# Patient Record
Sex: Female | Born: 1985 | Race: Black or African American | Hispanic: No | Marital: Single | Smoking: Current some day smoker
Health system: Southern US, Community
[De-identification: ages and names within clinical notes are randomized; demographics above are authoritative.]

---

## 2020-12-14 ENCOUNTER — Emergency Department (HOSPITAL_COMMUNITY): Payer: Medicaid Other

## 2020-12-14 ENCOUNTER — Encounter (HOSPITAL_COMMUNITY): Payer: Self-pay | Admitting: Emergency Medicine

## 2020-12-14 ENCOUNTER — Encounter (HOSPITAL_BASED_OUTPATIENT_CLINIC_OR_DEPARTMENT_OTHER): Payer: Self-pay | Admitting: *Deleted

## 2020-12-14 ENCOUNTER — Emergency Department (HOSPITAL_COMMUNITY)
Admission: EM | Admit: 2020-12-14 | Discharge: 2020-12-14 | Discharge status: Against Medical Advice | Payer: Medicaid Other | Source: Home / Self Care | Attending: Emergency Medicine | Admitting: Emergency Medicine

## 2020-12-14 ENCOUNTER — Emergency Department (HOSPITAL_BASED_OUTPATIENT_CLINIC_OR_DEPARTMENT_OTHER)
Admission: EM | Admit: 2020-12-14 | Discharge: 2020-12-14 | Disposition: A | Discharge status: Home / Self Care | Payer: Medicaid Other | Attending: Emergency Medicine | Admitting: Emergency Medicine

## 2020-12-14 ENCOUNTER — Other Ambulatory Visit: Payer: Self-pay

## 2020-12-14 DIAGNOSIS — X58XXXA Exposure to other specified factors, initial encounter: Secondary | ICD-10-CM | POA: Diagnosis not present

## 2020-12-14 DIAGNOSIS — Z23 Encounter for immunization: Secondary | ICD-10-CM | POA: Insufficient documentation

## 2020-12-14 DIAGNOSIS — Z5321 Procedure and treatment not carried out due to patient leaving prior to being seen by health care provider: Secondary | ICD-10-CM | POA: Insufficient documentation

## 2020-12-14 DIAGNOSIS — S61211A Laceration without foreign body of left index finger without damage to nail, initial encounter: Secondary | ICD-10-CM | POA: Insufficient documentation

## 2020-12-14 DIAGNOSIS — R0789 Other chest pain: Secondary | ICD-10-CM | POA: Insufficient documentation

## 2020-12-14 LAB — CBC
HCT: 43 % (ref 36.0–46.0)
Hemoglobin: 14.3 g/dL (ref 12.0–15.0)
MCH: 31.6 pg (ref 26.0–34.0)
MCHC: 33.3 g/dL (ref 30.0–36.0)
MCV: 94.9 fL (ref 80.0–100.0)
Platelets: 417 10*3/uL — ABNORMAL HIGH (ref 150–400)
RBC: 4.53 MIL/uL (ref 3.87–5.11)
RDW: 13.5 % (ref 11.5–15.5)
WBC: 11.3 10*3/uL — ABNORMAL HIGH (ref 4.0–10.5)
nRBC: 0 % (ref 0.0–0.2)

## 2020-12-14 LAB — I-STAT BETA HCG BLOOD, ED (MC, WL, AP ONLY): I-stat hCG, quantitative: <5 m[IU]/mL (ref <5)

## 2020-12-14 LAB — TROPONIN I (HIGH SENSITIVITY)
Troponin I (High Sensitivity): 5 ng/L (ref <18)
Troponin I (High Sensitivity): 5 ng/L (ref <18)

## 2020-12-14 LAB — BASIC METABOLIC PANEL
Anion gap: 7 (ref 5–15)
BUN: 5 mg/dL — ABNORMAL LOW (ref 6–20)
CO2: 28 mmol/L (ref 22–32)
Calcium: 9.1 mg/dL (ref 8.9–10.3)
Chloride: 103 mmol/L (ref 98–111)
Creatinine, Ser: 0.79 mg/dL (ref 0.44–1.00)
GFR, Estimated: >60 mL/min (ref >60)
Glucose, Bld: 95 mg/dL (ref 70–99)
Potassium: 4 mmol/L (ref 3.5–5.1)
Sodium: 138 mmol/L (ref 135–145)

## 2020-12-14 MED ORDER — TETANUS-DIPHTH-ACELL PERTUSSIS 5-2.5-18.5 LF-MCG/0.5 IM SUSY
0.5000 mL | PREFILLED_SYRINGE | Freq: Once | INTRAMUSCULAR | Status: AC
  Administered 2020-12-14: 0.5 mL via INTRAMUSCULAR
  Filled 2020-12-14: qty 0.5
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
## 2021-03-28 ENCOUNTER — Encounter (HOSPITAL_BASED_OUTPATIENT_CLINIC_OR_DEPARTMENT_OTHER): Payer: Self-pay | Admitting: Obstetrics and Gynecology

## 2021-03-28 ENCOUNTER — Other Ambulatory Visit: Payer: Self-pay

## 2021-03-28 DIAGNOSIS — N3000 Acute cystitis without hematuria: Secondary | ICD-10-CM | POA: Diagnosis not present

## 2021-03-28 DIAGNOSIS — N390 Urinary tract infection, site not specified: Secondary | ICD-10-CM | POA: Insufficient documentation

## 2021-03-28 DIAGNOSIS — F17210 Nicotine dependence, cigarettes, uncomplicated: Secondary | ICD-10-CM | POA: Insufficient documentation

## 2021-03-28 DIAGNOSIS — R3915 Urgency of urination: Secondary | ICD-10-CM | POA: Diagnosis present

## 2021-03-28 LAB — URINALYSIS, ROUTINE W REFLEX MICROSCOPIC
Bilirubin Urine: NEGATIVE
Glucose, UA: NEGATIVE mg/dL
Ketones, ur: NEGATIVE mg/dL
Nitrite: POSITIVE — AB
Specific Gravity, Urine: 1.029 (ref 1.005–1.030)
Trans Epithel, UA: 2
WBC, UA: >50 WBC/hpf — ABNORMAL HIGH (ref 0–5)
pH: 6 (ref 5.0–8.0)

## 2021-03-28 LAB — PREGNANCY, URINE: Preg Test, Ur: NEGATIVE

## 2021-03-28 NOTE — ED Triage Notes (Signed)
Patient reports she has thinks she has a UTI. Patient states her urine is cloudy, she has abdominal/pelvic pressure and frequency.

## 2021-03-29 ENCOUNTER — Emergency Department (HOSPITAL_BASED_OUTPATIENT_CLINIC_OR_DEPARTMENT_OTHER)
Admission: EM | Admit: 2021-03-29 | Discharge: 2021-03-29 | Disposition: A | Discharge status: Home / Self Care | Payer: Medicaid Other | Attending: Emergency Medicine | Admitting: Emergency Medicine

## 2021-03-29 DIAGNOSIS — N3000 Acute cystitis without hematuria: Secondary | ICD-10-CM

## 2021-03-29 MED ORDER — CEPHALEXIN 500 MG PO CAPS: 500.0000 mg | ORAL_CAPSULE | Freq: Three times a day (TID) | ORAL | 0 refills | Status: AC

## 2021-03-29 MED ORDER — CEPHALEXIN 250 MG PO CAPS
500.0000 mg | ORAL_CAPSULE | Freq: Once | ORAL | Status: AC
  Administered 2021-03-29: 500 mg via ORAL
  Filled 2021-03-29: qty 2

## 2021-03-29 NOTE — ED Provider Notes (Signed)
[facility] EMERGENCY DEPT Provider Note   CSN: [id number] Arrival date & time: 03/28/21  2244     History Chief Complaint  Patient presents with   Urinary Tract Infection    Denise Long is a 35 y.o. female.  HPI     This is a 35 year old female who presents with urinary urgency.  Patient reports that she has had some pressure-like urgency over the last several days.  She states that she feels an urge to urinate frequently.  No specific dysuria.  She reports some pressure in the suprapubic region.  No fevers or back pain.  She has a history of UTIs while pregnant; however, she does not believe herself to be pregnant.  Onset of symptoms was yesterday.  He has noted a dark discoloration to her urine with a foul smell.  History reviewed. No pertinent past medical history.  There are no problems to display for this patient.   History reviewed. No pertinent surgical history.   OB History     Gravida      Para      Term      Preterm      AB      Living  3      SAB      IAB      Ectopic      Multiple      Live Births              No family history on file.  Social History   Tobacco Use   Smoking status: Every Day    Packs/day: 0.25    Years: 10.00    Pack years: 2.50    Types: Cigarettes    Passive exposure: Never   Smokeless tobacco: Never  Vaping Use   Vaping Use: Every day   Substances: Nicotine, Flavoring  Substance Use Topics   Alcohol use: Yes    Comment: Social   Drug use: Yes    Types: Cocaine, Marijuana    Home Medications Prior to Admission medications   Medication Sig Start Date End Date Taking? Authorizing Provider  cephALEXin (KEFLEX) 500 MG capsule Take 1 capsule (500 mg total) by mouth 3 (three) times daily. 03/29/21  Yes Kaevon Cotta, Mayer Masker, MD    Allergies    Sulfa antibiotics  Review of Systems   Review of Systems  Constitutional:  Negative for fever.  Gastrointestinal:  Negative for abdominal pain,  nausea and vomiting.  Genitourinary:  Positive for urgency. Negative for dysuria, frequency and vaginal discharge.  All other systems reviewed and are negative.  Physical Exam Updated Vital Signs BP (!) 177/124   Pulse 70   Temp 98.1 F (36.7 C)   Resp 16   SpO2 100%   Physical Exam Vitals and nursing note reviewed.  Constitutional:      Appearance: She is well-developed. She is not ill-appearing.  HENT:     Head: Normocephalic and atraumatic.  Eyes:     Pupils: Pupils are equal, round, and reactive to light.  Cardiovascular:     Rate and Rhythm: Normal rate and regular rhythm.     Heart sounds: Normal heart sounds.  Pulmonary:     Effort: Pulmonary effort is normal. No respiratory distress.     Breath sounds: No wheezing.  Abdominal:     General: Bowel sounds are normal.     Palpations: Abdomen is soft.     Tenderness: There is no right CVA tenderness or left CVA tenderness.  Musculoskeletal:     Cervical back: Neck supple.     Right lower leg: No edema.     Left lower leg: No edema.  Skin:    General: Skin is warm and dry.  Neurological:     Mental Status: She is alert and oriented to person, place, and time.  Psychiatric:        Mood and Affect: Mood normal.    ED Results / Procedures / Treatments   Labs (all labs ordered are listed, but only abnormal results are displayed) Labs Reviewed  URINALYSIS, ROUTINE W REFLEX MICROSCOPIC - Abnormal; Notable for the following components:      Result Value   APPearance HAZY (*)    Hgb urine dipstick LARGE (*)    Protein, ur TRACE (*)    Nitrite POSITIVE (*)    Leukocytes,Ua LARGE (*)    WBC, UA >50 (*)    Bacteria, UA MANY (*)    All other components within normal limits  URINE CULTURE  PREGNANCY, URINE    EKG None  Radiology No results found.  Procedures Procedures   Medications Ordered in ED Medications  cephALEXin (KEFLEX) capsule 500 mg (has no administration in time range)    ED Course  I have  reviewed the triage vital signs and the nursing notes.  Pertinent labs & imaging results that were available during my care of the patient were reviewed by me and considered in my medical decision making (see chart for details).    MDM Rules/Calculators/A&P                           Patient presents with urinary symptoms.  She is overall nontoxic and vital signs are reassuring.  She has no signs or symptoms of pyelonephritis.  Symptoms consistent with UTI.  Urinalysis reviewed showing greater than 50 white cells and many bacteria with white blood cell clumps.  It is also nitrite positive.  This is very consistent with urinary tract infection.  Suspect cystitis.  Urine culture was sent.  Will start on Keflex.  After history, exam, and medical workup I feel the patient has been appropriately medically screened and is safe for discharge home. Pertinent diagnoses were discussed with the patient. Patient was given return precautions.  Final Clinical Impression(s) / ED Diagnoses Final diagnoses:  Acute cystitis without hematuria    Rx / DC Orders ED Discharge Orders          Ordered    cephALEXin (KEFLEX) 500 MG capsule  3 times daily        03/29/21 0325             Jeshurun [place], Mayer Masker, MD 03/29/21 [phone]

## 2021-03-31 LAB — URINE CULTURE: Culture: >=100000 — AB

## 2021-04-01 ENCOUNTER — Telehealth: Payer: Self-pay | Admitting: Emergency Medicine

## 2021-04-01 NOTE — Telephone Encounter (Signed)
Post ED Visit - Positive Culture Follow-up  Culture report reviewed by antimicrobial stewardship pharmacist: [facility] []  , Pharm.D. []  Enzo Bi, Pharm.D., BCPS AQ-ID []  , Pharm.D., BCPS []  Celedonio Miyamoto, Pharm.D., BCPS []  [place], Garvin Fila.D., BCPS, AAHIVP []  , Pharm.D., BCPS, AAHIVP []  Georgina Pillion, PharmD, BCPS []  , PharmD, BCPS []  [place], PharmD, BCPS [x]  [address], PharmD []  , PharmD, BCPS []  Estella Husk, PharmD  Pharmacy Team []  Lysle Pearl, PharmD []  , PharmD []  Phillips Climes, PharmD []  , Rph []  Agapito Games) , PharmD []  Loleta Dicker, PharmD []  , PharmD []  Mervyn Gay, PharmD []  , PharmD []  Vinnie Level, PharmD []  [facility], PharmD []  , PharmD []  Len Childs, PharmD   Positive urine culture Treated with Cephalexin, organism sensitive to the same and no further patient follow-up is required at this time.  Evadene Wardrip 04/01/2021, 11:04 AM

## 2021-10-16 IMAGING — DX DG CHEST 2V
2 series · 2 of 2 positions shown · non-contrast
Comparison: 03/04/2018

CLINICAL DATA: Chest pain

EXAM:
CHEST - 2 VIEW

[w chest pa]
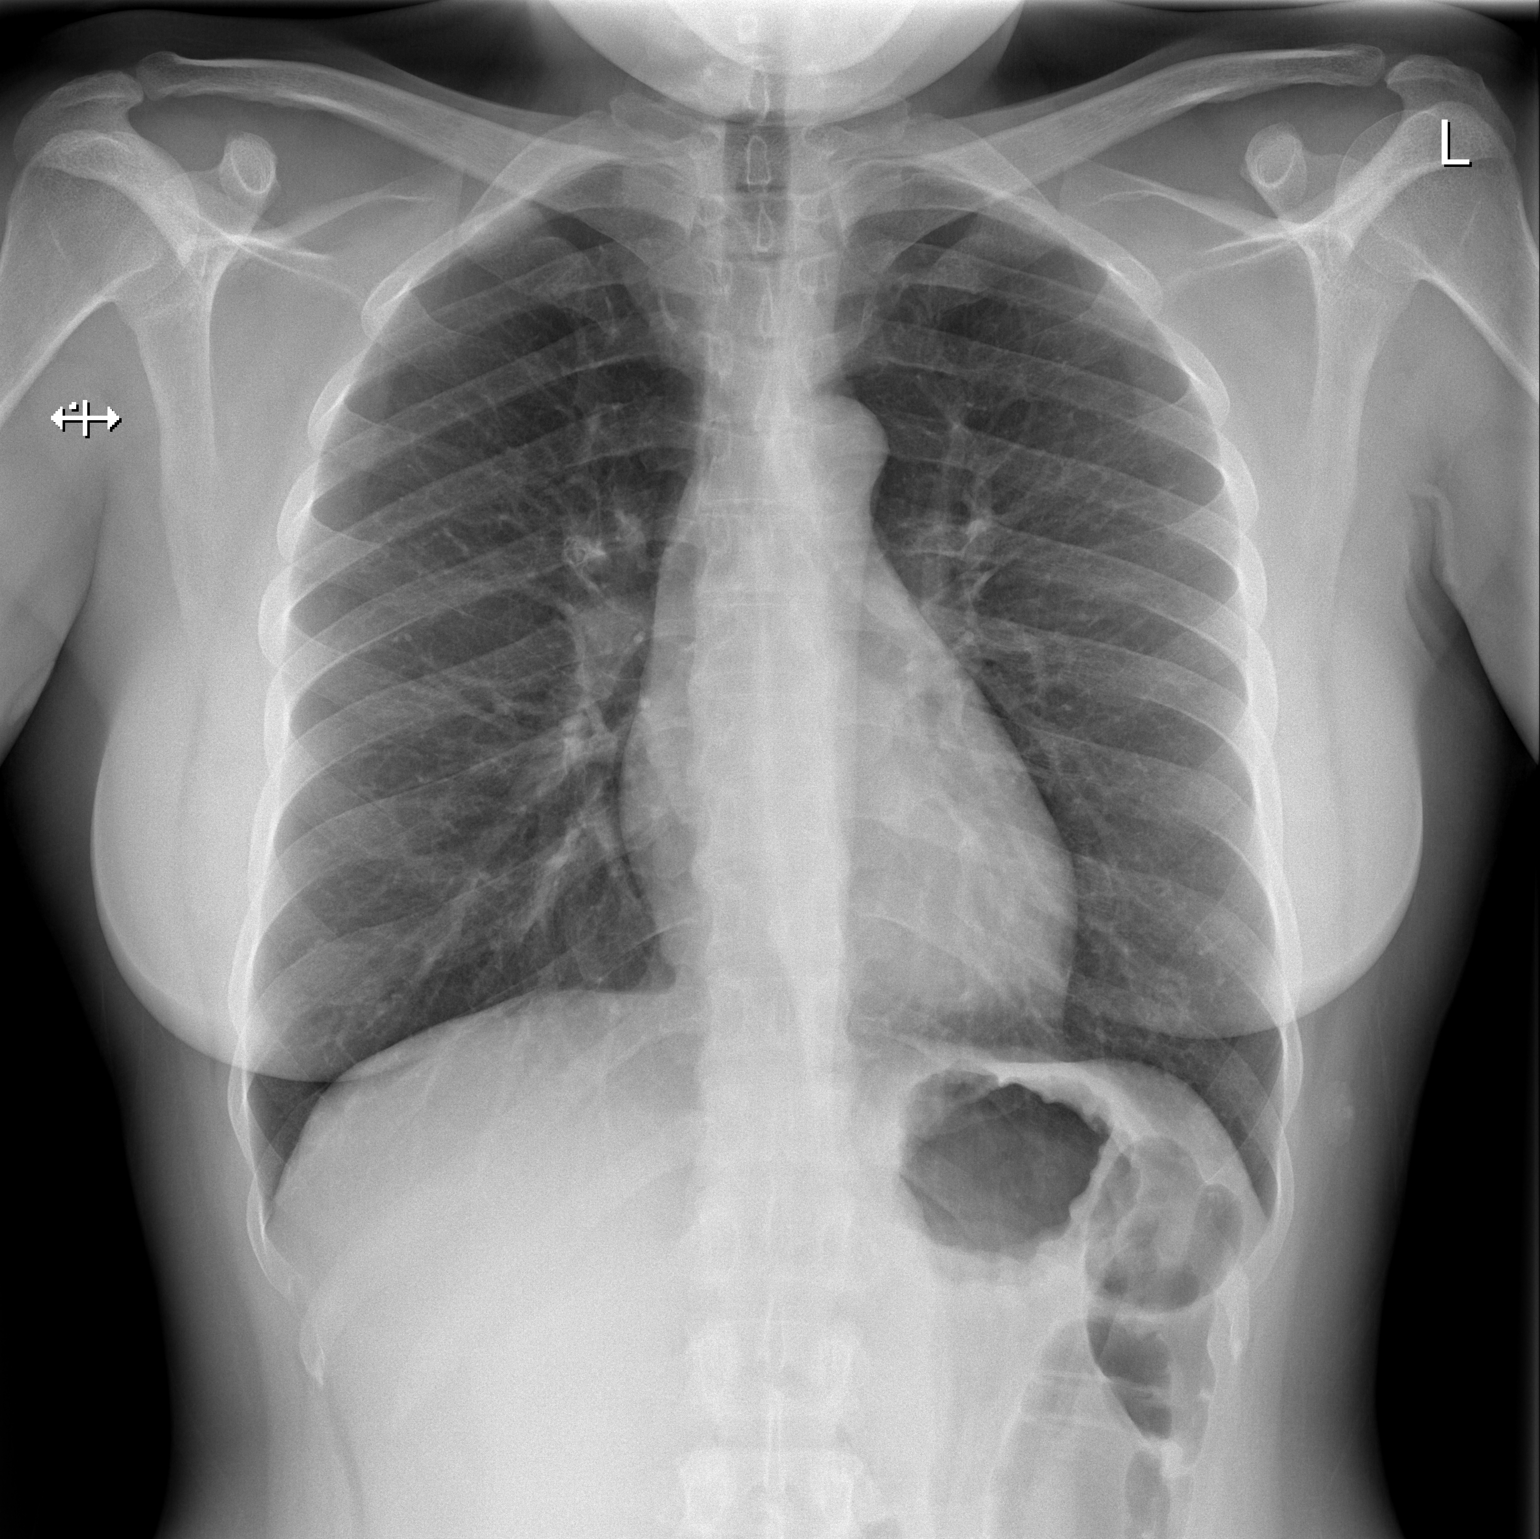

[w chest lat]
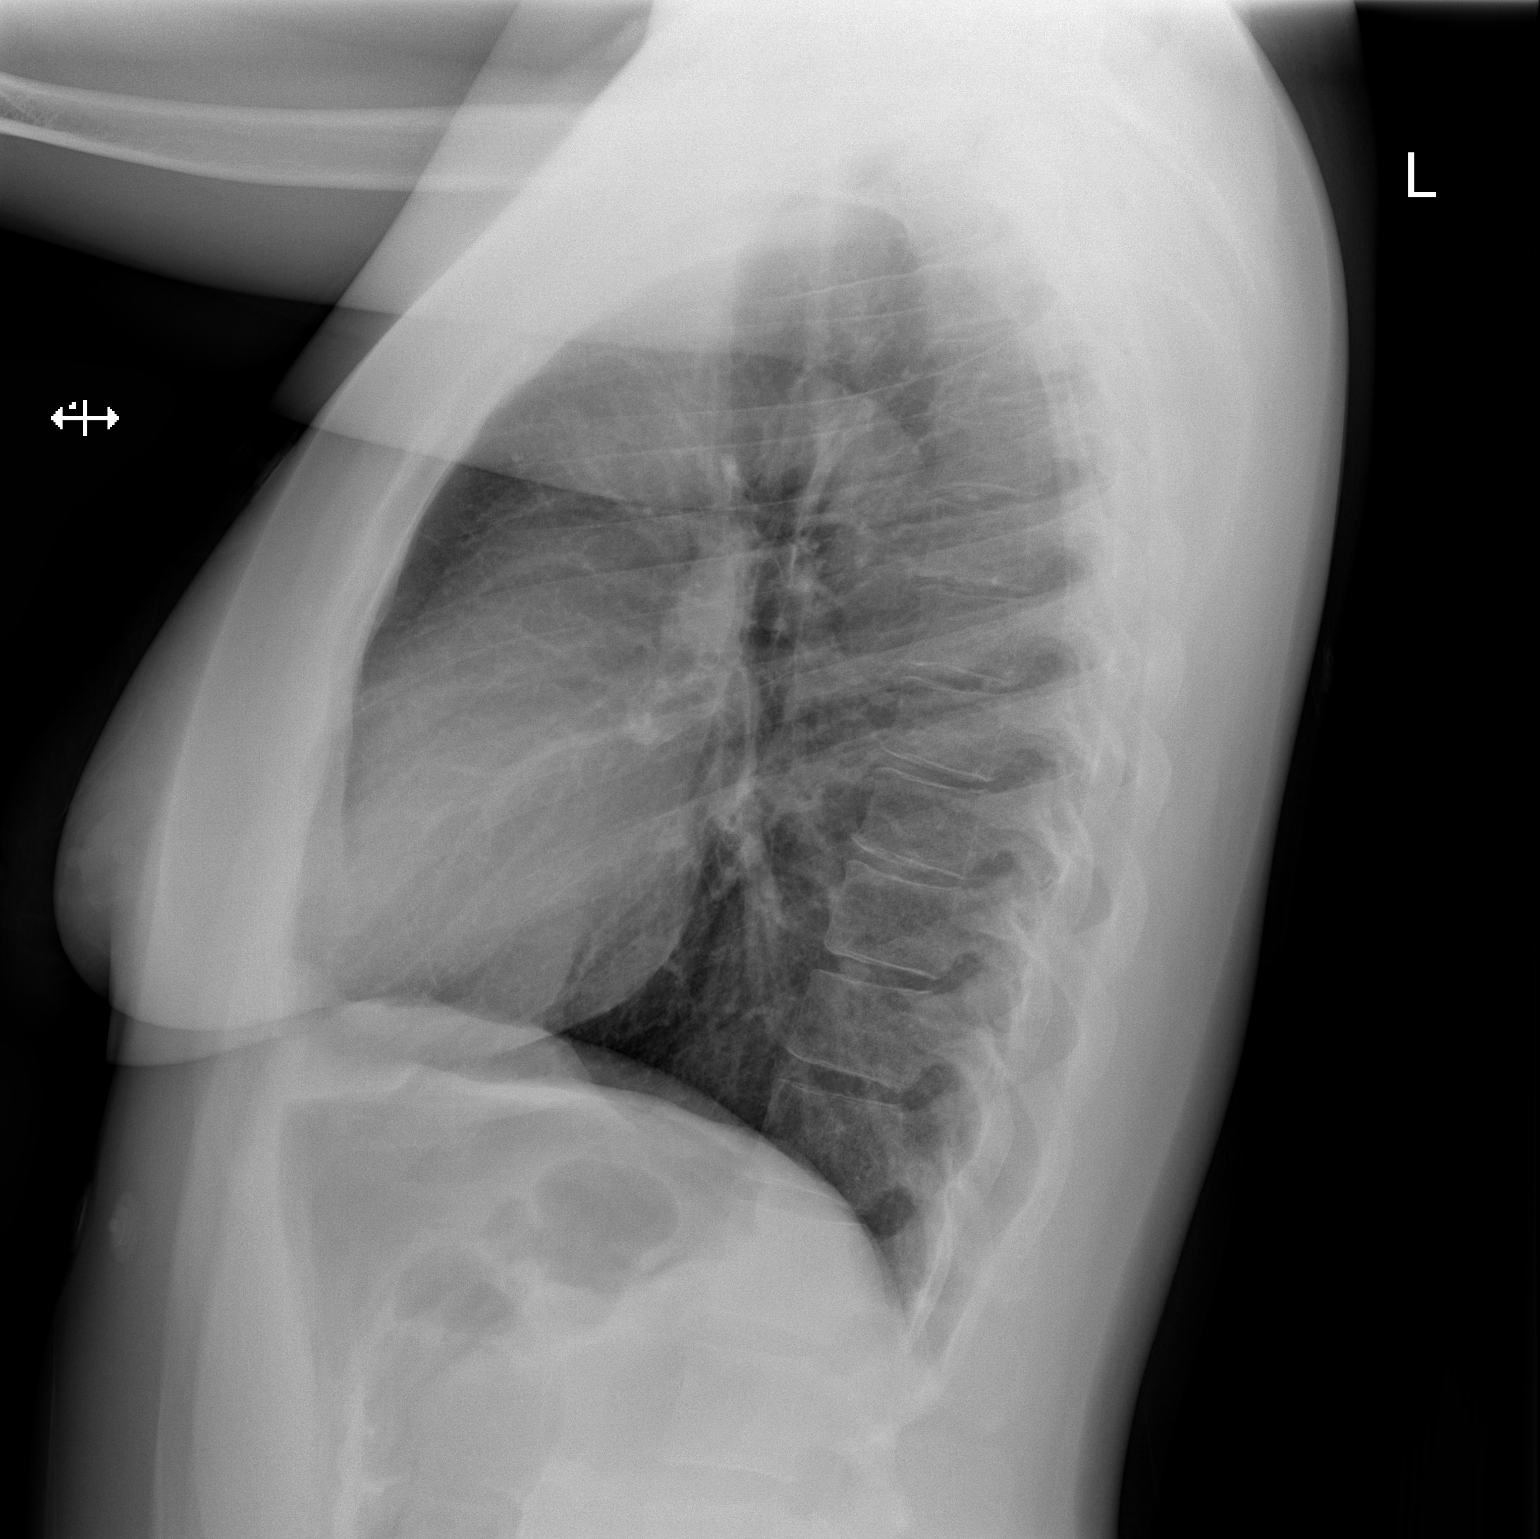

[2 of 2 positions shown; findings below may reference images not displayed]

FINDINGS: The heart size and mediastinal contours are within normal limits.
Both lungs are clear. The visualized skeletal structures are
unremarkable.
IMPRESSION: No active cardiopulmonary disease.

## 2022-03-12 ENCOUNTER — Encounter (HOSPITAL_BASED_OUTPATIENT_CLINIC_OR_DEPARTMENT_OTHER): Payer: Self-pay

## 2022-03-12 ENCOUNTER — Emergency Department (HOSPITAL_BASED_OUTPATIENT_CLINIC_OR_DEPARTMENT_OTHER)
Admission: EM | Admit: 2022-03-12 | Discharge: 2022-03-12 | Disposition: A | Discharge status: Home / Self Care | Payer: BLUE CROSS/BLUE SHIELD | Attending: Emergency Medicine | Admitting: Emergency Medicine

## 2022-03-12 ENCOUNTER — Other Ambulatory Visit: Payer: Self-pay

## 2022-03-12 DIAGNOSIS — R300 Dysuria: Secondary | ICD-10-CM | POA: Diagnosis present

## 2022-03-12 DIAGNOSIS — N3000 Acute cystitis without hematuria: Secondary | ICD-10-CM | POA: Insufficient documentation

## 2022-03-12 LAB — URINALYSIS, ROUTINE W REFLEX MICROSCOPIC
Bilirubin Urine: NEGATIVE
Glucose, UA: NEGATIVE mg/dL
Ketones, ur: NEGATIVE mg/dL
Nitrite: NEGATIVE
Protein, ur: 30 mg/dL — AB
Specific Gravity, Urine: 1.024 (ref 1.005–1.030)
WBC, UA: >50 WBC/hpf — ABNORMAL HIGH (ref 0–5)
pH: 7.5 (ref 5.0–8.0)

## 2022-03-12 LAB — PREGNANCY, URINE: Preg Test, Ur: NEGATIVE

## 2022-03-12 MED ORDER — CEPHALEXIN 250 MG PO CAPS
500.0000 mg | ORAL_CAPSULE | Freq: Once | ORAL | Status: AC
  Administered 2022-03-12: 500 mg via ORAL
  Filled 2022-03-12: qty 2

## 2022-03-12 MED ORDER — CEPHALEXIN 500 MG PO CAPS: 500.0000 mg | ORAL_CAPSULE | Freq: Three times a day (TID) | ORAL | 0 refills | Status: AC

## 2022-03-12 NOTE — ED Triage Notes (Addendum)
Patient here POV from Home.  Endorses Tingling when urinating along with Malodorous and Cloudy Urine Intermittently this Week.   No Fevers. No N/V/D. Also states she would to be assessed for STI(s).  NAD Noted during Triage. A&Ox4. GCS 15. Ambulatory.

## 2022-03-12 NOTE — ED Notes (Signed)
Pt ambulatory to room independently with steady gait escorted by ED triage nurse- pt brings along adolescent child with her.  When pt asked to change into gown for possible pelvic exam as triage note specifies that pt is requesting "STI" testing- pt states she meant HIV test only along with UTI testing

## 2022-03-12 NOTE — ED Notes (Signed)
ED Provider at bedside. 

## 2022-03-12 NOTE — ED Notes (Signed)
Pt agreeable with d/c plan as discussed by provider- this nurse has verbally reinforced d/c instructions and provided pt with written copy- pt acknowledges verbal understanding and denies any additional questions, concerns, needs- ambulatory independently at d/c with steady gait; vitals stable; no distress.  

## 2022-03-12 NOTE — ED Provider Notes (Signed)
[facility] EMERGENCY DEPT Provider Note   CSN: [id number] Arrival date & time: 03/12/22  2034     History  Chief Complaint  Patient presents with   Dysuria    Denise Long is a 36 y.o. female.  HPI     This is a 36 year old female who presents with dysuria.  Patient reports she has had 1 week history of burning with urination specifically in the morning.  She states that it would get better throughout the day.  However, it has become progressively more constant.  She has not had any back pain or fevers.  No nausea, vomiting, diarrhea.  She states she is not concerned about STDs but initially wanted to be evaluated for HIV.  She states that now she does not want to be evaluated for HIV and just wants to go home.  She denies any high risk sexual behaviors.  Home Medications Prior to Admission medications   Medication Sig Start Date End Date Taking? Authorizing Provider  cephALEXin (KEFLEX) 500 MG capsule Take 1 capsule (500 mg total) by mouth 3 (three) times daily. 03/12/22  Yes Arlana Canizales, Mayer Masker, MD  cephALEXin (KEFLEX) 500 MG capsule Take 1 capsule (500 mg total) by mouth 3 (three) times daily. 03/29/21   Rilyn Upshaw, Mayer Masker, MD      Allergies    Sulfa antibiotics    Review of Systems   Review of Systems  Genitourinary:  Positive for dysuria.  All other systems reviewed and are negative.   Physical Exam Updated Vital Signs BP (!) 136/90   Pulse 72   Temp 98.3 F (36.8 C)   Resp 16   Ht 1.702 m (5\' 7" )   Wt 86.2 kg   SpO2 99%   BMI 29.76 kg/m  Physical Exam Vitals and nursing note reviewed.  Constitutional:      Appearance: She is well-developed. She is obese. She is not ill-appearing.  HENT:     Head: Normocephalic and atraumatic.  Eyes:     Pupils: Pupils are equal, round, and reactive to light.  Cardiovascular:     Rate and Rhythm: Normal rate and regular rhythm.     Heart sounds: Normal heart sounds.  Pulmonary:     Effort: Pulmonary effort  is normal. No respiratory distress.  Abdominal:     General: Bowel sounds are normal.     Palpations: Abdomen is soft.     Tenderness: There is no right CVA tenderness or left CVA tenderness.  Musculoskeletal:     Cervical back: Neck supple.  Skin:    General: Skin is warm and dry.  Neurological:     Mental Status: She is alert and oriented to person, place, and time.  Psychiatric:        Mood and Affect: Mood normal.     ED Results / Procedures / Treatments   Labs (all labs ordered are listed, but only abnormal results are displayed) Labs Reviewed  URINALYSIS, ROUTINE W REFLEX MICROSCOPIC - Abnormal; Notable for the following components:      Result Value   APPearance HAZY (*)    Hgb urine dipstick TRACE (*)    Protein, ur 30 (*)    Leukocytes,Ua MODERATE (*)    WBC, UA >50 (*)    Bacteria, UA RARE (*)    Non Squamous Epithelial 0-5 (*)    All other components within normal limits  URINE CULTURE  PREGNANCY, URINE    EKG None  Radiology No results found.  Procedures  Procedures    Medications Ordered in ED Medications  cephALEXin (KEFLEX) capsule 500 mg (500 mg Oral Given 03/12/22 2316)    ED Course/ Medical Decision Making/ A&P                           Medical Decision Making Amount and/or Complexity of Data Reviewed Labs: ordered.  Risk Prescription drug management.   This patient presents to the ED for concern of dysuria, this involves an extensive number of treatment options, and is a complaint that carries with it a high risk of complications and morbidity.  I considered the following differential and admission for this acute, potentially life threatening condition.  The differential diagnosis includes UTI, STD, pyelonephritis, other vaginal irritation such as contact dermatitis  MDM:    This is a 36 year old female who presents with dysuria.  She is nontoxic and vital signs are reassuring.  She is afebrile.  No CVA tenderness.  Urinalysis is  consistent with UTI.  Sent urine culture.  Patient was given a dose of Keflex.  She is declining any further evaluation or STD testing.  Declines pelvic exam at this time.  (Labs, imaging, consults)  Labs: I Ordered, and personally interpreted labs.  The pertinent results include: Urinalysis, urine pregnancy  Imaging Studies ordered: I ordered imaging studies including none I independently visualized and interpreted imaging. I agree with the radiologist interpretation  Additional history obtained from chart review.  External records from outside source obtained and reviewed including evaluations  Cardiac Monitoring: The patient was maintained on a cardiac monitor.  I personally viewed and interpreted the cardiac monitored which showed an underlying rhythm of: Normal sinus rhythm  Reevaluation: After the interventions noted above, I reevaluated the patient and found that they have :improved  Social Determinants of Health: Lives independently  Disposition: Discharge  Co morbidities that complicate the patient evaluation History reviewed. No pertinent past medical history.   Medicines Meds ordered this encounter  Medications   cephALEXin (KEFLEX) capsule 500 mg   cephALEXin (KEFLEX) 500 MG capsule    Sig: Take 1 capsule (500 mg total) by mouth 3 (three) times daily.    Dispense:  30 capsule    Refill:  0    I have reviewed the patients home medicines and have made adjustments as needed  Problem List / ED Course: Problem List Items Addressed This Visit   None Visit Diagnoses     Acute cystitis without hematuria    -  Primary                   Final Clinical Impression(s) / ED Diagnoses Final diagnoses:  Acute cystitis without hematuria    Rx / DC Orders ED Discharge Orders          Ordered    cephALEXin (KEFLEX) 500 MG capsule  3 times daily        03/12/22 2322              Shon Baton, MD 03/12/22 2330

## 2022-03-12 NOTE — Discharge Instructions (Signed)
You were seen today for dysuria.  You will be treated for urinary tract infection.  If you develop back pain or fevers, you should be reevaluated.

## 2022-03-15 LAB — URINE CULTURE: Culture: 20000 — AB

## 2022-03-16 ENCOUNTER — Telehealth: Payer: Self-pay | Admitting: *Deleted

## 2022-03-16 NOTE — Telephone Encounter (Signed)
Post ED Visit - Positive Culture Follow-up  Culture report reviewed by antimicrobial stewardship pharmacist: [facility] []  , Pharm.D. []  Enzo Bi, Pharm.D., BCPS AQ-ID []  , Pharm.D., BCPS []  Celedonio Miyamoto, Pharm.D., BCPS []  [place], Garvin Fila.D., BCPS, AAHIVP []  , Pharm.D., BCPS, AAHIVP []  Georgina Pillion, PharmD, BCPS []  , PharmD, BCPS []  [place], PharmD, BCPS []  [address], PharmD []  , PharmD, BCPS []  Estella Husk, PharmD  Pharmacy Team []  Lysle Pearl, PharmD []  , PharmD []  Phillips Climes, PharmD []  , Rph []  Agapito Games) , PharmD []  Verlan Friends, PharmD []  , PharmD []  Mervyn Gay, PharmD []  , PharmD []  Vinnie Level, PharmD []  [facility], PharmD []  , PharmD []  Len Childs, PharmD   Positive urine culture Treated with Cephalexin, organism sensitive to the same and no further patient follow-up is required at this time.  [facility] 03/16/2022, 9:35 AM
